# Patient Record
Sex: Male | Born: 1990 | Race: White | Hispanic: No | Marital: Single | Smoking: Never smoker
Health system: Southern US, Community
[De-identification: ages and names within clinical notes are randomized; demographics above are authoritative.]

## PROBLEM LIST (undated history)

## (undated) DIAGNOSIS — R51 Headache: Secondary | ICD-10-CM

---

## 2011-09-07 ENCOUNTER — Emergency Department (HOSPITAL_COMMUNITY)
Admission: EM | Admit: 2011-09-07 | Discharge: 2011-09-07 | Disposition: A | Discharge status: Home / Self Care | Payer: Self-pay | Attending: Emergency Medicine | Admitting: Emergency Medicine

## 2011-09-07 ENCOUNTER — Encounter (HOSPITAL_COMMUNITY): Payer: Self-pay

## 2011-09-07 DIAGNOSIS — K029 Dental caries, unspecified: Secondary | ICD-10-CM | POA: Insufficient documentation

## 2011-09-07 DIAGNOSIS — K0889 Other specified disorders of teeth and supporting structures: Secondary | ICD-10-CM

## 2011-09-07 MED ORDER — HYDROCODONE-ACETAMINOPHEN 5-325 MG PO TABS: ORAL_TABLET | ORAL | Status: AC

## 2011-09-07 MED ORDER — AMOXICILLIN 500 MG PO CAPS: 500.0000 mg | ORAL_CAPSULE | Freq: Three times a day (TID) | ORAL | Status: AC

## 2011-09-07 MED ORDER — AMOXICILLIN 250 MG PO CAPS
500.0000 mg | ORAL_CAPSULE | Freq: Once | ORAL | Status: AC
  Administered 2011-09-07: 500 mg via ORAL
  Filled 2011-09-07: qty 2

## 2011-09-07 MED ORDER — HYDROCODONE-ACETAMINOPHEN 5-325 MG PO TABS
1.0000 | ORAL_TABLET | Freq: Once | ORAL | Status: AC
  Administered 2011-09-07: 1 via ORAL
  Filled 2011-09-07: qty 1

## 2011-09-07 NOTE — ED Notes (Signed)
Pt reports tooth aching for the last few days. Pt has not contacted a dentist at this time.

## 2011-09-07 NOTE — ED Notes (Signed)
Pt alert & oriented x4, stable gait. Pt given discharge instructions, paperwork & prescription(s). Patient instructed to stop at the registration desk to finish any additional paperwork. pt verbalized understanding. Pt given a dental packet w/ discharge papers. Pt left department w/ no further questions.

## 2011-09-07 NOTE — Discharge Instructions (Signed)
Dental Pain A tooth ache may be caused by cavities (tooth decay). Cavities expose the nerve of the tooth to air and hot or cold temperatures. It may come from an infection or abscess (also called a boil or furuncle) around your tooth. It is also often caused by dental caries (tooth decay). This causes the pain you are having. DIAGNOSIS  Your caregiver can diagnose this problem by exam. TREATMENT   If caused by an infection, it may be treated with medications which kill germs (antibiotics) and pain medications as prescribed by your caregiver. Take medications as directed.   Only take over-the-counter or prescription medicines for pain, discomfort, or fever as directed by your caregiver.   Whether the tooth ache today is caused by infection or dental disease, you should see your dentist as soon as possible for further care.  SEEK MEDICAL CARE IF: The exam and treatment you received today has been provided on an emergency basis only. This is not a substitute for complete medical or dental care. If your problem worsens or new problems (symptoms) appear, and you are unable to meet with your dentist, call or return to this location. SEEK IMMEDIATE MEDICAL CARE IF:   You have a fever.   You develop redness and swelling of your face, jaw, or neck.   You are unable to open your mouth.   You have severe pain uncontrolled by pain medicine.  MAKE SURE YOU:   Understand these instructions.   Will watch your condition.   Will get help right away if you are not doing well or get worse.  Document Released: 03/24/2005 Document Revised: 03/13/2011 Document Reviewed: 11/10/2007 ExitCare Patient Information 2012 [employer].  RESOURCE GUIDE  Chronic Pain Problems: Contact [facility]  [phone] Patients need to be referred by their primary care doctor.  Insufficient Money for Medicine: Contact United Way:  call "211" or [facility] [phone].  No Primary Care  Doctor: - Call [facility]  [phone] - can help you locate a primary care doctor that  accepts your insurance, provides certain services, etc. - Physician Referral Service- 1-800-533-3463  Agencies that provide inexpensive medical care: - [facility]  [phone] - [facility]  [phone] - [facility]  [phone] - [facility]  [phone] - Planned Parenthood  [phone] - [facility]  [phone]  Medicaid-accepting [place] Providers: - [facility]- [address]  [phone], Mon-Fri 9am-7pm, Sat 9am-1pm - [facility]- [address], [address]  [phone] - [facility]- [address], [address]  [phone] - [facility]- [address] [place] [address]  [phone] - Veita Bland- [address], [phone]  Only accepts [place] Access Medicaid patients after they have their name  applied to their card  Self Pay (no insurance) in [place]: - Sickle Cell Patients: Dr Eric Dean, [facility]  [address], [phone] - [facility]- [address]  [phone]       -     [facility]- [address]       -     [facility]- see information above (Speak to Pam H if you do not have insurance)       -  [facility]- [address], [phone]       -    [facility] [place]- [address],  [phone]       -  [facility]- [address] [place] [address], [phone]       -  Dr Osei-Bonsu-  [address], [place], [phone]       -  [facility]- [address], [phone]       -  [facility]- [address] [place] [address], [phone], also [address], [phone]       -    [facility]- [address], [phone], 1st & 3rd Saturday   every month, 10am-1pm  1) Find a Doctor and Pay Out of  Pocket Although you won't have to find out who is covered by your insurance plan, it is a good idea to ask around and get recommendations. You will then need to call the office and see if the doctor you have chosen will accept you as a new patient and what types of options they offer for patients who are self-pay. Some doctors offer discounts or will set up payment plans for their patients who do not have insurance, but you will need to ask so you aren't surprised when you get to your appointment.  2) Contact Your Local Health Department Not all health departments have doctors that can see patients for sick visits, but many do, so it is worth a call to see if yours does. If you don't know where your local health department is, you can check in your phone book. The CDC also has a tool to help you locate your state's health department, and many state websites also have listings of all of their local health departments.  3) Find a Walk-in Clinic If your illness is not likely to be very severe or complicated, you may want to try a walk in clinic. These are popping up all over the country in pharmacies, drugstores, and shopping centers. They're usually staffed by nurse practitioners or physician assistants that have been trained to treat common illnesses and complaints. They're usually fairly quick and inexpensive. However, if you have serious medical issues or chronic medical problems, these are probably not your best option  STD Testing - [place] [facility] [place], [facility] Clinic, [address], [place], phone [phone] or 1-877-539-9860.  Monday - Friday, call for an appointment. - [place] [facility] [place], [facility] Clinic, [address], [place], phone [phone] or 1-877-539-9860.  Monday - Friday, call for an appointment.  Abuse/Neglect: - [place] Child Abuse Hotline (336) 641-3795 - [place] Child Abuse Hotline 800-378-5315  (After Hours)  Emergency Shelter:  [facility] (336) 271-5985  Maternity Homes: - [facility] (336) 275-9566 - [facility] (704) 372-4663  MRSA Hotline #:   832-7006  [place] Resources  [facility] [place]  [employer] [place] [facility] [address]                 [address]         [address]  [place]                                               [place]                                [place] Phone:  [phone]                                  Phone:  [phone]                   Phone:  [phone]  [place] [facility], [phone] - [place] Services - [facility]- 1-888-581-9988       -     [facility] in [place], [address],                                  336-349-4454, Insurance  [place] Child Abuse Hotline (336) 342-1394 or (336) 342-3537 (After Hours)   Behavioral Health Services  Substance Abuse Resources: - [facility]  336-882-2125 - [facility] 336-784-9470 - [facility] 336-285-9073 - [facility] 336-845-3988 - Residential & Outpatient Substance Abuse Program  800-659-3381  Psychological Services: - [facility]  832-9600 - [facility]  [phone] - [place] [facility], [address], [place], ACCESS LINE: 1-800-853-5163 or 336-641-4981, Http://www.[facility].com/services/adult.htm  Dental Assistance  If unable to pay or uninsured, contact:  [facility] or [place] [facility] to become qualified for the adult dental clinic.  Patients with Medicaid: [facility] [address], [phone], [phone]  If unable to pay, or uninsured, contact [facility] ([phone]) or [place] [facility] ([phone] in , [phone] in [place]) to  become qualified for the adult dental clinic  Other Low-Cost Community Dental Services: - [facility]- [address], [place], [place], [address], [phone], [address], 2nd and 4th Thursday of the month at 6:30am.  10 clients each day by appointment, can sometimes see walk-in patients if someone does not show for an appointment. - [facility]- [address], [place], [place], [address], [phone] - [facility]- [address], [place], [place], [address], [phone] - [place] [facility]- [phone] - [facility]- [phone] -  [facility]- [phone]     

## 2011-09-07 NOTE — ED Notes (Signed)
Back tooth hurting, started a couple of days ago per pt.

## 2011-09-07 NOTE — ED Provider Notes (Signed)
Medical screening examination/treatment/procedure(s) were performed by non-physician practitioner and as supervising physician I was immediately available for consultation/collaboration.  Amita Atayde, MD 09/07/11 0703 

## 2011-09-07 NOTE — ED Provider Notes (Signed)
History     CSN: [id number]  Arrival date & time 09/07/11  4098   First MD Initiated Contact with Patient 09/07/11 0048      Chief Complaint  Patient presents with  . Dental Pain    (Consider location/radiation/quality/duration/timing/severity/associated sxs/prior treatment) Patient is a 21 y.o. male presenting with tooth pain. The history is provided by the patient.  Dental PainThe primary symptoms include mouth pain. Primary symptoms do not include dental injury, headaches, fever, shortness of breath, sore throat, angioedema or cough. The symptoms began 3 to 5 days ago. The symptoms are worsening. The symptoms are new. The symptoms occur constantly.  Mouth pain began 3 - 5 days ago. Mouth pain occurs constantly. Mouth pain is worsening. Affected locations include: teeth and gum(s).  Additional symptoms include: dental sensitivity to temperature, gum tenderness and jaw pain. Additional symptoms do not include: gum swelling, trismus, facial swelling, trouble swallowing, ear pain and swollen glands. Medical issues include: periodontal disease.    History reviewed. No pertinent past medical history.  History reviewed. No pertinent past surgical history.  History reviewed. No pertinent family history.  History  Substance Use Topics  . Smoking status: Passive Smoker  . Smokeless tobacco: Not on file  . Alcohol Use: No      Review of Systems  Constitutional: Negative for fever, activity change and appetite change.  HENT: Positive for dental problem. Negative for ear pain, congestion, sore throat, facial swelling, trouble swallowing, neck pain and neck stiffness.   Eyes: Negative for pain and visual disturbance.  Respiratory: Negative for cough and shortness of breath.   Neurological: Negative for dizziness, facial asymmetry and headaches.  Hematological: Negative for adenopathy.  All other systems reviewed and are negative.    Allergies  Review of patient's allergies  indicates no known allergies.  Home Medications  No current outpatient prescriptions on file.  BP 144/70  Pulse 80  Temp(Src) 98.2 F (36.8 C) (Oral)  Resp 20  Ht 5\' 7"  (1.702 m)  Wt 160 lb (72.576 kg)  BMI 25.06 kg/m2  SpO2 100%  Physical Exam  Nursing note and vitals reviewed. Constitutional: He is oriented to person, place, and time. He appears well-developed and well-nourished. No distress.  HENT:  Head: Normocephalic and atraumatic. No trismus in the jaw.  Right Ear: Tympanic membrane and ear canal normal.  Left Ear: Tympanic membrane and ear canal normal.  Mouth/Throat: Uvula is midline, oropharynx is clear and moist and mucous membranes are normal. Dental caries present. No dental abscesses or uvula swelling.         ttp of the left lower third molar  Neck: Normal range of motion. Neck supple.  Cardiovascular: Normal rate, regular rhythm and normal heart sounds.   No murmur heard. Pulmonary/Chest: Effort normal and breath sounds normal.  Musculoskeletal: Normal range of motion.  Lymphadenopathy:    He has no cervical adenopathy.  Neurological: He is alert and oriented to person, place, and time. He exhibits normal muscle tone. Coordination normal.  Skin: Skin is warm and dry.    ED Course  Procedures (including critical care time)  Labs Reviewed - No data to display      MDM     Previous medical charts, nursing notes and vitals signs from this visit were reviewed by me   All laboratory results and/or imaging results performed on this visit, if applicable, were reviewed by me and discussed with the patient and/or parent as well as recommendation for follow-up  MEDICATIONS GIVEN IN ED: norco, amoxil  Patient has ttp of the left lower third molar.  Tooth appears slightly impacted.  Mild erythema of the surrounding gums.  No facial swelling, trismus or obvious abscess.  Pt agrees to f/u with a dentist.     PRESCRIPTIONS GIVEN AT DISCHARGE:  Amoxil,  norco #12     Pt stable in ED with no significant deterioration in condition. Pt feels improved after observation and/or treatment in ED. Patient / Family / Caregiver understand and agree with initial ED impression and plan with expectations set for ED visit.  Patient agrees to return to ED for any worsening symptoms       Crissy Mccreadie L. Deneshia Zucker, PA 09/07/11 0100

## 2011-10-18 ENCOUNTER — Emergency Department (HOSPITAL_COMMUNITY): Payer: Self-pay

## 2011-10-18 ENCOUNTER — Emergency Department (HOSPITAL_COMMUNITY)
Admission: EM | Admit: 2011-10-18 | Discharge: 2011-10-18 | Disposition: A | Discharge status: Home / Self Care | Payer: Self-pay | Attending: Emergency Medicine | Admitting: Emergency Medicine

## 2011-10-18 ENCOUNTER — Encounter (HOSPITAL_COMMUNITY): Payer: Self-pay | Admitting: *Deleted

## 2011-10-18 DIAGNOSIS — IMO0002 Reserved for concepts with insufficient information to code with codable children: Secondary | ICD-10-CM | POA: Insufficient documentation

## 2011-10-18 DIAGNOSIS — S20219A Contusion of unspecified front wall of thorax, initial encounter: Secondary | ICD-10-CM

## 2011-10-18 DIAGNOSIS — R071 Chest pain on breathing: Secondary | ICD-10-CM | POA: Insufficient documentation

## 2011-10-18 DIAGNOSIS — T07XXXA Unspecified multiple injuries, initial encounter: Secondary | ICD-10-CM

## 2011-10-18 MED ORDER — HYDROCODONE-ACETAMINOPHEN 5-325 MG PO TABS
1.0000 | ORAL_TABLET | Freq: Once | ORAL | Status: AC
  Administered 2011-10-18: 1 via ORAL
  Filled 2011-10-18: qty 1

## 2011-10-18 MED ORDER — IBUPROFEN 800 MG PO TABS
800.0000 mg | ORAL_TABLET | Freq: Once | ORAL | Status: AC
  Administered 2011-10-18: 800 mg via ORAL
  Filled 2011-10-18: qty 1

## 2011-10-18 MED ORDER — IBUPROFEN 800 MG PO TABS: 800.0000 mg | ORAL_TABLET | Freq: Three times a day (TID) | ORAL | Status: AC

## 2011-10-18 MED ORDER — HYDROCODONE-ACETAMINOPHEN 5-325 MG PO TABS: ORAL_TABLET | ORAL | Status: AC
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
## 2011-10-18 NOTE — Discharge Instructions (Signed)
Abrasions An abrasion is a scraped area on the skin. Abrasions do not go through all layers of the skin.  HOME CARE  Change any bandages (dressings) as told by your doctor. If the bandage sticks, soak it off with warm, soapy water. Change the bandage if it gets wet, dirty, or starts to smell.   Wash the area with soap and water twice a day. Rinse off the soap. Pat the area dry with a clean towel.   Look at the injured area for signs of infection. Infection signs include redness, puffiness (swelling), tenderness, or yellowish white fluid (pus) coming from the wound.   Apply medicated cream as told by your doctor.   Only take medicine as told by your doctor.   Follow up with your doctor as told.  GET HELP RIGHT AWAY IF:   You have more pain in your wound.   You have redness, puffiness (swelling), or tenderness around your wound.   You have yellowish white fluid (pus) coming from your wound.   You have a fever.   A bad smell is coming from the wound or bandage.  MAKE SURE YOU:   Understand these instructions.   Will watch your condition.   Will get help right away if you are not doing well or get worse.  Document Released: 09/10/2007 Document Revised: 03/13/2011 Document Reviewed: 02/25/2011 [facility] Patient Information 2012 [place], [place].Chest Contusion A contusion is a deep bruise. Bruises happen when an injury causes bleeding under the skin. Signs of bruising include pain, puffiness (swelling), and discolored skin. The bruise may turn blue, purple, or yellow. Pay attention to how you are doing. HOME CARE  Put ice on the injured area.   Put ice in a plastic bag.   Place a towel between the skin and the bag.   Leave the ice on for 15 to 20 minutes at a time, 3 to 4 times a day for the first 48 hours.   Rest.   Do not lift anything heavy.   Limit your activity as told by your doctor   Take 3 to 4 deep breaths every hour while awake. Hold your hand or a pillow over  the sore area for support.   Breathe from the belly (abdomen).   Breathe in through the nose, as if you are smelling a flower.   Breathe out through the mouth, as if you are blowing out a candle.   Only take medicine as told by your doctor.  GET HELP RIGHT AWAY IF:   You have trouble breathing or cough up thick spit (mucus).   You have chest pain that goes into the arms or jaw.   The skin is wet and pale.   You have a fever.   You feel dizzy, weak, or pass out (faint).   You cannot breathe easily.   The bruise is getting worse.  MAKE SURE YOU:   Understand these instructions.   Will watch your condition.   Will get help right away if you are not doing well or get worse.  Document Released: 09/10/2007 Document Revised: 03/13/2011 Document Reviewed: 09/10/2007 [employer] Patient Information 2012 [employer], [place]ib Contusion A rib contusion (bruise) can occur by a blow to the chest or by a fall against a hard object. Usually these will be much better in a couple weeks. If X-rays were taken today and there are no broken bones (fractures), the diagnosis of bruising is made. However, broken ribs may not show up for several days,  or may be discovered later on a routine X-ray when signs of healing show up. If this happens to you, it does not mean that something was missed on the X-ray, but simply that it did not show up on the first X-rays. Earlier diagnosis will not usually change the treatment. HOME CARE INSTRUCTIONS   Avoid strenuous activity. Be careful during activities and avoid bumping the injured ribs. Activities that pull on the injured ribs and cause pain should be avoided, if possible.   For the first day or two, an ice pack used every 20 minutes while awake may be helpful. Put ice in a plastic bag and put a towel between the bag and the skin.   Eat a normal, well-balanced diet. Drink plenty of fluids to avoid constipation.   Take deep breaths several times a day to keep  lungs free of infection. Try to cough several times a day. Splint the injured area with a pillow while coughing to ease pain. Coughing can help prevent pneumonia.   Wear a rib belt or binder only if told to do so by your caregiver. If you are wearing a rib belt or binder, you must do the breathing exercises as directed by your caregiver. If not used properly, rib belts or binders restrict breathing which can lead to pneumonia.   Only take over-the-counter or prescription medicines for pain, discomfort, or fever as directed by your caregiver.  SEEK MEDICAL CARE IF:   You or your child has an oral temperature above 102 F (38.9 C).   Your baby is older than 3 months with a rectal temperature of 100.5 F (38.1 C) or higher for more than 1 day.   You develop a cough, with thick or bloody sputum.  SEEK IMMEDIATE MEDICAL CARE IF:   You have difficulty breathing.   You feel sick to your stomach (nausea), have vomiting or belly (abdominal) pain.   You have worsening pain, not controlled with medications, or there is a change in the location of the pain.   You develop sweating or radiation of the pain into the arms, jaw or shoulders, or become light headed or faint.   You or your child has an oral temperature above 102 F (38.9 C), not controlled by medicine.   Your or your baby is older than 3 months with a rectal temperature of 102 F (38.9 C) or higher.   Your baby is 29 months old or younger with a rectal temperature of 100.4 F (38 C) or higher.  MAKE SURE YOU:   Understand these instructions.   Will watch your condition.   Will get help right away if you are not doing well or get worse.  Document Released: 12/17/2000 Document Revised: 03/13/2011 Document Reviewed: 11/10/2007 [facility] Patient Information 2012 [place], [place].C.

## 2011-10-19 NOTE — ED Provider Notes (Signed)
Medical screening examination/treatment/procedure(s) were performed by non-physician practitioner and as supervising physician I was immediately available for consultation/collaboration. Devoria Albe, MD, FACEP   Ward Givens, MD 10/19/11 [phone]

## 2012-04-21 ENCOUNTER — Encounter (HOSPITAL_COMMUNITY): Payer: Self-pay | Admitting: Emergency Medicine

## 2012-04-21 ENCOUNTER — Emergency Department (HOSPITAL_COMMUNITY): Payer: Self-pay

## 2012-04-21 ENCOUNTER — Emergency Department (HOSPITAL_COMMUNITY)
Admission: EM | Admit: 2012-04-21 | Discharge: 2012-04-21 | Disposition: A | Discharge status: Home / Self Care | Payer: Self-pay | Attending: Emergency Medicine | Admitting: Emergency Medicine

## 2012-04-21 DIAGNOSIS — R42 Dizziness and giddiness: Secondary | ICD-10-CM | POA: Insufficient documentation

## 2012-04-21 DIAGNOSIS — Z79899 Other long term (current) drug therapy: Secondary | ICD-10-CM | POA: Insufficient documentation

## 2012-04-21 DIAGNOSIS — R0602 Shortness of breath: Secondary | ICD-10-CM | POA: Insufficient documentation

## 2012-04-21 LAB — CBC WITH DIFFERENTIAL/PLATELET
Eosinophils Relative: 1 % (ref 0–5)
Lymphocytes Relative: 19 % (ref 12–46)
Lymphs Abs: 1.7 10*3/uL (ref 0.7–4.0)
MCV: 83.9 fL (ref 78.0–100.0)
Platelets: 231 10*3/uL (ref 150–400)
RBC: 5.53 MIL/uL (ref 4.22–5.81)
WBC: 9.1 10*3/uL (ref 4.0–10.5)

## 2012-04-21 LAB — D-DIMER, QUANTITATIVE: D-Dimer, Quant: <0.27 ug/mL-FEU (ref 0.00–0.48)

## 2012-04-21 LAB — BASIC METABOLIC PANEL
CO2: 29 mEq/L (ref 19–32)
Calcium: 9.9 mg/dL (ref 8.4–10.5)
Chloride: 98 mEq/L (ref 96–112)
Sodium: 138 mEq/L (ref 135–145)

## 2012-04-21 MED ORDER — MECLIZINE HCL 12.5 MG PO TABS
25.0000 mg | ORAL_TABLET | Freq: Once | ORAL | Status: AC
  Administered 2012-04-21: 25 mg via ORAL
  Filled 2012-04-21: qty 2

## 2012-04-21 MED ORDER — MECLIZINE HCL 25 MG PO TABS: 25.0000 mg | ORAL_TABLET | Freq: Four times a day (QID) | ORAL | Status: DC

## 2012-04-21 MED ORDER — ALBUTEROL SULFATE HFA 108 (90 BASE) MCG/ACT IN AERS: 1.0000 | INHALATION_SPRAY | Freq: Four times a day (QID) | RESPIRATORY_TRACT | Status: DC | PRN

## 2012-04-21 MED ORDER — ALBUTEROL SULFATE (5 MG/ML) 0.5% IN NEBU
2.5000 mg | INHALATION_SOLUTION | Freq: Once | RESPIRATORY_TRACT | Status: AC
  Administered 2012-04-21: 2.5 mg via RESPIRATORY_TRACT
  Filled 2012-04-21: qty 0.5

## 2012-04-21 NOTE — ED Provider Notes (Signed)
History  This chart was scribed for Shelda Jakes, MD by Erskine Emery, ED Scribe. This patient was seen in room APA19/APA19 and the patient's care was started at 07:30.   CSN: [id number]  Arrival date & time 04/21/12  0716   First MD Initiated Contact with Patient 04/21/12 0730      Chief Complaint  Patient presents with  . Shortness of Breath    (Consider location/radiation/quality/duration/timing/severity/associated sxs/prior treatment) The history is provided by the patient. No language interpreter was used.  Miguel Ballard is a 22 y.o. male who presents to the Emergency Department complaining of SOB since last night. Pt reports he is fine when he yawns but he just can't seem to get a deep breath. He denies walking aggravates the SOB. Pt denies any associated cough, congestion, nausea, fever, chills, sore throat, rhinorrhea, visual changes, neck pain, back pain, chest pain, emesis, diarrhea, abdominal pain, dysuria, rash, h/o bleeding easily, or injury but reports some dizziness upon standing with no vertigo. Pt has no h/o asthma or wheezing. He is otherwise perfectly healthy.  History reviewed. No pertinent past medical history.  History reviewed. No pertinent past surgical history.  No family history on file.  History  Substance Use Topics  . Smoking status: Passive Smoke Exposure - Never Smoker  . Smokeless tobacco: Not on file  . Alcohol Use: No      Review of Systems  Constitutional: Negative for fever and chills.  HENT: Negative for congestion, rhinorrhea and neck pain.   Eyes: Negative for visual disturbance.  Respiratory: Positive for shortness of breath. Negative for cough.   Cardiovascular: Negative for chest pain.  Gastrointestinal: Negative for nausea, vomiting, abdominal pain and diarrhea.  Genitourinary: Negative for dysuria.  Musculoskeletal: Negative for back pain.  Skin: Negative for rash.  Neurological: Positive for dizziness.  All other systems  reviewed and are negative.    Allergies  Review of patient's allergies indicates no known allergies.  Home Medications   Current Outpatient Rx  Name  Route  Sig  Dispense  Refill  . IBUPROFEN 200 MG PO TABS   Oral   Take 600 mg by mouth every 6 (six) hours as needed. Headache.         . ALBUTEROL SULFATE HFA 108 (90 BASE) MCG/ACT IN AERS   Inhalation   Inhale 1-2 puffs into the lungs every 6 (six) hours as needed for wheezing.   1 Inhaler   0   . MECLIZINE HCL 25 MG PO TABS   Oral   Take 1 tablet (25 mg total) by mouth 4 (four) times daily.   28 tablet   0     Triage Vitals: BP 130/83  Pulse 70  Temp 97.8 F (36.6 C) (Oral)  Resp 18  Ht 5\' 7"  (1.702 m)  Wt 161 lb (73.029 kg)  BMI 25.22 kg/m2  SpO2 100%  Physical Exam  Nursing note and vitals reviewed. Constitutional: He is oriented to person, place, and time. He appears well-developed and well-nourished. No distress.  HENT:  Head: Normocephalic and atraumatic.  Mouth/Throat: Oropharynx is clear and moist. No oropharyngeal exudate.       Mild erythema to the back of the throat.  Eyes: EOM are normal. Pupils are equal, round, and reactive to light.  Neck: Neck supple. No tracheal deviation present.  Cardiovascular: Normal rate, regular rhythm and normal heart sounds.   Pulmonary/Chest: Effort normal and breath sounds normal. No respiratory distress. He has no wheezes.  Abdominal: Soft.  Bowel sounds are normal. He exhibits no distension. There is no tenderness.  Musculoskeletal: Normal range of motion. He exhibits no edema.  Lymphadenopathy:    He has no cervical adenopathy.  Neurological: He is alert and oriented to person, place, and time. No cranial nerve deficit. Coordination normal.       Mild component of vertigo: pt reports aggravated dizziness upon moving his head back and forth.  Skin: Skin is warm and dry.  Psychiatric: He has a normal mood and affect.    ED Course  Procedures (including critical  care time) DIAGNOSTIC STUDIES: Oxygen Saturation is 100% on room air, normal by my interpretation.    COORDINATION OF CARE: 07:52--I evaluated the patient and we discussed a treatment plan including chest x-ray and basic labs to which the pt agreed.   08:39--I rechecked the pt and notified him that his chest x-ray looks clear but that we are still waiting for his blood work and breathing treatment.  08:54--I rechecked the pt.   Results for orders placed during the hospital encounter of 04/21/12  BASIC METABOLIC PANEL      Component Value Range   Sodium 138  135 - 145 mEq/L   Potassium 3.3 (*) 3.5 - 5.1 mEq/L   Chloride 98  96 - 112 mEq/L   CO2 29  19 - 32 mEq/L   Glucose, Bld 96  70 - 99 mg/dL   BUN 6  6 - 23 mg/dL   Creatinine, Ser 1.61  0.50 - 1.35 mg/dL   Calcium 9.9  8.4 - 09.6 mg/dL   GFR calc non Af Amer >90  >90 mL/min   GFR calc Af Amer >90  >90 mL/min  CBC WITH DIFFERENTIAL      Component Value Range   WBC 9.1  4.0 - 10.5 K/uL   RBC 5.53  4.22 - 5.81 MIL/uL   Hemoglobin 15.7  13.0 - 17.0 g/dL   HCT 04.5  40.9 - 81.1 %   MCV 83.9  78.0 - 100.0 fL   MCH 28.4  26.0 - 34.0 pg   MCHC 33.8  30.0 - 36.0 g/dL   RDW 91.4  78.2 - 95.6 %   Platelets 231  150 - 400 K/uL   Neutrophils Relative 73  43 - 77 %   Neutro Abs 6.7  1.7 - 7.7 K/uL   Lymphocytes Relative 19  12 - 46 %   Lymphs Abs 1.7  0.7 - 4.0 K/uL   Monocytes Relative 7  3 - 12 %   Monocytes Absolute 0.7  0.1 - 1.0 K/uL   Eosinophils Relative 1  0 - 5 %   Eosinophils Absolute 0.1  0.0 - 0.7 K/uL   Basophils Relative 0  0 - 1 %   Basophils Absolute 0.0  0.0 - 0.1 K/uL  D-DIMER, QUANTITATIVE      Component Value Range   D-Dimer, Quant <0.27  0.00 - 0.48 ug/mL-FEU   Dg Chest 2 View  04/21/2012  *RADIOLOGY REPORT*  Clinical Data: Short of breath, chest pain  CHEST - 2 VIEW  Comparison: Prior chest x-ray 10/18/2011  Findings: The lungs are well-aerated and free from pulmonary edema, focal airspace consolidation or  pulmonary nodule.  Cardiac and mediastinal contours are within normal limits.  No pneumothorax, or pleural effusion. No acute osseous findings.  IMPRESSION:  No acute cardiopulmonary disease.   Original Report Authenticated By: Malachy Moan, M.D.       1. Shortness of breath  2. Dizziness       MDM   Chest x-ray negative for pneumonia or pneumothorax screening test for pulmonary embolism was negative. Patient without significant improvement with albuterol inhaler and had no wheezing. Some improvement with the dizziness which had some mild vertigo type component to it with the Antivert. Will continue patient on Antivert albuterol inhaler. Patient's pulse ox very normal in the emergency department.     I personally performed the services described in this documentation, which was scribed in my presence. The recorded information has been reviewed and is accurate.     Shelda Jakes, MD 04/21/12 [phone]

## 2012-04-21 NOTE — ED Notes (Addendum)
Pt c/o sob since last night. Denies cough/congestion. Denies injury. Also reports some dizziness. Pt tearful, appears anxious.

## 2012-07-07 ENCOUNTER — Emergency Department (HOSPITAL_COMMUNITY): Payer: Self-pay

## 2012-07-07 ENCOUNTER — Emergency Department (HOSPITAL_COMMUNITY)
Admission: EM | Admit: 2012-07-07 | Discharge: 2012-07-08 | Disposition: A | Discharge status: Home / Self Care | Payer: Self-pay | Attending: Emergency Medicine | Admitting: Emergency Medicine

## 2012-07-07 ENCOUNTER — Encounter (HOSPITAL_COMMUNITY): Payer: Self-pay | Admitting: *Deleted

## 2012-07-07 DIAGNOSIS — Z79899 Other long term (current) drug therapy: Secondary | ICD-10-CM | POA: Insufficient documentation

## 2012-07-07 DIAGNOSIS — R51 Headache: Secondary | ICD-10-CM | POA: Insufficient documentation

## 2012-07-07 HISTORY — DX: Headache: R51

## 2012-07-07 MED ORDER — METOCLOPRAMIDE HCL 5 MG/ML IJ SOLN
10.0000 mg | Freq: Once | INTRAMUSCULAR | Status: AC
  Administered 2012-07-07: 10 mg via INTRAMUSCULAR
  Filled 2012-07-07: qty 2

## 2012-07-07 MED ORDER — KETOROLAC TROMETHAMINE 60 MG/2ML IM SOLN
60.0000 mg | Freq: Once | INTRAMUSCULAR | Status: AC
  Administered 2012-07-07: 60 mg via INTRAMUSCULAR
  Filled 2012-07-07: qty 2

## 2012-07-07 MED ORDER — DIPHENHYDRAMINE HCL 50 MG/ML IJ SOLN
25.0000 mg | Freq: Once | INTRAMUSCULAR | Status: AC
  Administered 2012-07-07: 25 mg via INTRAMUSCULAR
  Filled 2012-07-07: qty 1

## 2012-07-07 NOTE — ED Notes (Signed)
Pt c/o HA since yesterday, denies N/V, has taken OTC meds- aleve and Goody powder, last took 0800 this morning per pt.

## 2012-07-07 NOTE — ED Provider Notes (Signed)
History  This chart was scribed for Miguel Octave, MD by Shari Heritage, ED Scribe. The patient was seen in room APA12/APA12. Patient's care was started at 2217.   CSN: [id number]  Arrival date & time 07/07/12  2140   First MD Initiated Contact with Patient 07/07/12 2217      Chief Complaint  Patient presents with  . Headache     The history is provided by the patient. A language interpreter was used.    HPI Comments: Miguel Ballard is a 22 y.o. male who presents to the Emergency Department complaining of gradual, progressively worsening, moderate to severe, constant diffuse headache onset  yesterday. He states that headache started in the frontal area and is now generalized. He states that he was sitting down when headache began. Patient reports that he had the headache when he went to bed last night and it was still present upon waking this morning. He says that current headache is similar to past in position and character, but is more severe. Patient denies visual changes, vomiting, abdominal pain, diarrhea, fever or chills. He denies recent sore throat, rhinorrhea or congestion. He has taken Aleve and Goody's Powder with no relief. He has no known allergies to medications. Patient does not smoke or use alcohol.    Past Medical History  Diagnosis Date  . Headache     History reviewed. No pertinent past surgical history.  History reviewed. No pertinent family history.  History  Substance Use Topics  . Smoking status: Passive Smoke Exposure - Never Smoker  . Smokeless tobacco: Not on file  . Alcohol Use: No      Review of Systems A complete 10 system review of systems was obtained and all systems are negative except as noted in the HPI and PMH.   Allergies  Review of patient's allergies indicates no known allergies.  Home Medications   Current Outpatient Rx  Name  Route  Sig  Dispense  Refill  . naproxen sodium (ALEVE) 220 MG tablet   Oral   Take 440 mg by mouth daily  as needed (for pain).         Marland Kitchen albuterol (PROVENTIL HFA;VENTOLIN HFA) 108 (90 BASE) MCG/ACT inhaler   Inhalation   Inhale 1-2 puffs into the lungs every 6 (six) hours as needed for wheezing.   1 Inhaler   0     Triage Vitals: BP 140/89  Pulse 90  Temp(Src) 99.3 F (37.4 C) (Oral)  Resp 24  Wt 155 lb 6.4 oz (70.489 kg)  BMI 24.33 kg/m2  SpO2 100%  Physical Exam  Constitutional: He is oriented to person, place, and time. He appears well-developed and well-nourished.  HENT:  Head: Normocephalic and atraumatic.  Mouth/Throat: Oropharynx is clear and moist and mucous membranes are normal. Mucous membranes are not dry.  Neck: Normal range of motion. Neck supple.  No meningismus.  Cardiovascular: Normal rate, regular rhythm and normal heart sounds.   Pulmonary/Chest: Effort normal and breath sounds normal.  Abdominal: Soft. He exhibits no distension. There is no tenderness.  Musculoskeletal: Normal range of motion. He exhibits no edema and no tenderness.  Neurological: He is alert and oriented to person, place, and time.  CN 2-12 intact. No ataxia on finger to nose bilaterally. 5/5 strength throughout.   Skin: Skin is warm. No rash noted.    ED Course  Procedures (including critical care time) DIAGNOSTIC STUDIES: Oxygen Saturation is 100% on room air, normal by my interpretation.    COORDINATION OF  CARE: 10:33 PM- Patient informed of current plan for treatment and evaluation and agrees with plan at this time.     Labs Reviewed - No data to display Ct Head Wo Contrast  07/07/2012  *RADIOLOGY REPORT*  Clinical Data: Frontal headaches today.  CT HEAD WITHOUT CONTRAST  Technique:  Contiguous axial images were obtained from the base of the skull through the vertex without contrast.  Comparison: None.  Findings: The ventricles and sulci are symmetrical without significant effacement, displacement, or dilatation. No mass effect or midline shift. No abnormal extra-axial fluid  collections. The grey-white matter junction is distinct. Basal cisterns are not effaced. No acute intracranial hemorrhage. No depressed skull fractures.  Visualized paranasal sinuses and mastoid air cells are not opacified.  IMPRESSION: No acute intracranial abnormalities.   Original Report Authenticated By: Burman Nieves, M.D.      1. Headache       MDM  2 day history of gradually worsening frontal headache that is minimally responsive to aleve. No nausea, vomiting, fever, weakness numbness or tingling. Denies thunderclap onset.  Patient is a poor historian. He has a nonfocal neurological exam. No meningismus. History not consistent with subarachnoid hemorrhage or meningitis.  He is given medication with improvement in his headache. He is tolerating by mouth.    I personally performed the services described in this documentation, which was scribed in my presence. The recorded information has been reviewed and is accurate.    Miguel Octave, MD 07/08/12 0010

## 2013-04-01 ENCOUNTER — Encounter (HOSPITAL_COMMUNITY): Payer: Self-pay | Admitting: Emergency Medicine

## 2013-04-01 ENCOUNTER — Emergency Department (HOSPITAL_COMMUNITY): Payer: BC Managed Care – HMO

## 2013-04-01 ENCOUNTER — Emergency Department (HOSPITAL_COMMUNITY)
Admission: EM | Admit: 2013-04-01 | Discharge: 2013-04-01 | Disposition: A | Discharge status: Home / Self Care | Payer: BC Managed Care – HMO | Attending: Emergency Medicine | Admitting: Emergency Medicine

## 2013-04-01 DIAGNOSIS — S62339A Displaced fracture of neck of unspecified metacarpal bone, initial encounter for closed fracture: Secondary | ICD-10-CM

## 2013-04-01 DIAGNOSIS — S62309A Unspecified fracture of unspecified metacarpal bone, initial encounter for closed fracture: Secondary | ICD-10-CM | POA: Insufficient documentation

## 2013-04-01 MED ORDER — OXYCODONE-ACETAMINOPHEN 5-325 MG PO TABS: 2.0000 | ORAL_TABLET | Freq: Four times a day (QID) | ORAL | Status: DC | PRN
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
## 2013-04-01 NOTE — ED Provider Notes (Signed)
Medical screening examination/treatment/procedure(s) were performed by non-physician practitioner and as supervising physician I was immediately available for consultation/collaboration.  EKG Interpretation   None         Diarra Ceja L Yocelyn Brocious, MD 04/01/13 2015 
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
## 2013-04-01 NOTE — ED Notes (Signed)
Pt seen and evaluated by EDPa for initial assessment. 

## 2013-04-09 ENCOUNTER — Emergency Department (HOSPITAL_COMMUNITY)
Admission: EM | Admit: 2013-04-09 | Discharge: 2013-04-09 | Disposition: A | Discharge status: Home / Self Care | Payer: Self-pay | Attending: Emergency Medicine | Admitting: Emergency Medicine

## 2013-04-09 ENCOUNTER — Encounter (HOSPITAL_COMMUNITY): Payer: Self-pay | Admitting: Emergency Medicine

## 2013-04-09 DIAGNOSIS — G8911 Acute pain due to trauma: Secondary | ICD-10-CM | POA: Insufficient documentation

## 2013-04-09 DIAGNOSIS — Z760 Encounter for issue of repeat prescription: Secondary | ICD-10-CM | POA: Insufficient documentation

## 2013-04-09 DIAGNOSIS — M25549 Pain in joints of unspecified hand: Secondary | ICD-10-CM | POA: Insufficient documentation

## 2013-04-09 MED ORDER — KETOROLAC TROMETHAMINE 10 MG PO TABS
10.0000 mg | ORAL_TABLET | Freq: Once | ORAL | Status: AC
  Administered 2013-04-09: 10 mg via ORAL
  Filled 2013-04-09: qty 1

## 2013-04-09 MED ORDER — DICLOFENAC SODIUM 75 MG PO TBEC: 75.0000 mg | DELAYED_RELEASE_TABLET | Freq: Two times a day (BID) | ORAL | Status: DC

## 2013-04-09 MED ORDER — ACETAMINOPHEN 325 MG PO TABS
650.0000 mg | ORAL_TABLET | Freq: Once | ORAL | Status: AC
  Administered 2013-04-09: 650 mg via ORAL
  Filled 2013-04-09: qty 2

## 2013-04-09 NOTE — Discharge Instructions (Signed)
No acute changes noted on your hand examination tonight. It is important that you use your splint until seen by the hand specialist. Prescription for diclofenac given at this time. Please use Tylenol extra strength in between the doses of diclofenac. Please see your primary physician, or health department, or physicians at the adult medicine clinic for assistance with this problem until you can see your specialist. The emergency department is not set up for pain management. Medication Refill, Emergency Department We have refilled your medication today as a courtesy to you. It is best for your medical care, however, to take care of getting refills done through your primary caregiver's office. They have your records and can do a better job of follow-up than we can in the emergency department. On maintenance medications, we often only prescribe enough medications to get you by until you are able to see your regular caregiver. This is a more expensive way to refill medications. In the future, please plan for refills so that you will not have to use the emergency department for this. Thank you for your help. Your help allows us to better take care of the daily emergencies that enter our department. Document Released: 07/11/2003 Document Revised: 06/16/2011 Document Reviewed: 03/24/2005 [facility] Patient Information 2014 [place], [place].
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
## 2013-04-10 NOTE — ED Provider Notes (Signed)
Medical screening examination/treatment/procedure(s) were performed by non-physician practitioner and as supervising physician I was immediately available for consultation/collaboration.  EKG Interpretation   None       Pauletta Pickney, MD, FACEP   Montel Vanderhoof L Kashis Penley, MD 04/10/13 0031 

## 2013-12-16 ENCOUNTER — Emergency Department (HOSPITAL_COMMUNITY)
Admission: EM | Admit: 2013-12-16 | Discharge: 2013-12-16 | Disposition: A | Discharge status: Home / Self Care | Payer: Self-pay

## 2014-08-07 ENCOUNTER — Emergency Department (HOSPITAL_COMMUNITY)
Admission: EM | Admit: 2014-08-07 | Discharge: 2014-08-07 | Disposition: A | Discharge status: Home / Self Care | Payer: Self-pay | Attending: Emergency Medicine | Admitting: Emergency Medicine

## 2014-08-07 ENCOUNTER — Encounter (HOSPITAL_COMMUNITY): Payer: Self-pay | Admitting: Emergency Medicine

## 2014-08-07 DIAGNOSIS — Y9289 Other specified places as the place of occurrence of the external cause: Secondary | ICD-10-CM | POA: Insufficient documentation

## 2014-08-07 DIAGNOSIS — Y9389 Activity, other specified: Secondary | ICD-10-CM | POA: Insufficient documentation

## 2014-08-07 DIAGNOSIS — S0501XA Injury of conjunctiva and corneal abrasion without foreign body, right eye, initial encounter: Secondary | ICD-10-CM | POA: Insufficient documentation

## 2014-08-07 DIAGNOSIS — W228XXA Striking against or struck by other objects, initial encounter: Secondary | ICD-10-CM | POA: Insufficient documentation

## 2014-08-07 DIAGNOSIS — Y998 Other external cause status: Secondary | ICD-10-CM | POA: Insufficient documentation

## 2014-08-07 DIAGNOSIS — H1131 Conjunctival hemorrhage, right eye: Secondary | ICD-10-CM | POA: Insufficient documentation

## 2014-08-07 DIAGNOSIS — Z23 Encounter for immunization: Secondary | ICD-10-CM | POA: Insufficient documentation

## 2014-08-07 DIAGNOSIS — S0990XA Unspecified injury of head, initial encounter: Secondary | ICD-10-CM | POA: Insufficient documentation

## 2014-08-07 DIAGNOSIS — Z791 Long term (current) use of non-steroidal anti-inflammatories (NSAID): Secondary | ICD-10-CM | POA: Insufficient documentation

## 2014-08-07 MED ORDER — TETRACAINE HCL 0.5 % OP SOLN
1.0000 [drp] | Freq: Once | OPHTHALMIC | Status: AC
  Administered 2014-08-07: 1 [drp] via OPHTHALMIC
  Filled 2014-08-07: qty 2

## 2014-08-07 MED ORDER — FLUORESCEIN SODIUM 1 MG OP STRP
1.0000 | ORAL_STRIP | Freq: Once | OPHTHALMIC | Status: AC
  Administered 2014-08-07: 1 via OPHTHALMIC
  Filled 2014-08-07: qty 1

## 2014-08-07 MED ORDER — ERYTHROMYCIN 5 MG/GM OP OINT
TOPICAL_OINTMENT | Freq: Once | OPHTHALMIC | Status: AC
  Administered 2014-08-07: 20:00:00 via OPHTHALMIC
  Filled 2014-08-07: qty 3.5

## 2014-08-07 MED ORDER — TETANUS-DIPHTH-ACELL PERTUSSIS 5-2.5-18.5 LF-MCG/0.5 IM SUSP
0.5000 mL | Freq: Once | INTRAMUSCULAR | Status: AC
  Administered 2014-08-07: 0.5 mL via INTRAMUSCULAR
  Filled 2014-08-07: qty 0.5

## 2014-08-07 MED ORDER — IBUPROFEN 800 MG PO TABS
800.0000 mg | ORAL_TABLET | Freq: Once | ORAL | Status: AC
  Administered 2014-08-07: 800 mg via ORAL
  Filled 2014-08-07: qty 1
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
## 2014-08-07 NOTE — ED Provider Notes (Signed)
CSN: [id number]     Arrival date & time 08/07/14  4098 History  This chart was scribed for non-physician practitioner, Burgess Amor, PA-C, working with Bethann Berkshire, MD, by Modena Jansky, ED Scribe. This patient was seen in room APFT23/APFT23 and the patient's care was started at 7:14 PM.   Chief Complaint  Patient presents with  . Eye Injury   The history is provided by the patient. No language interpreter was used.   HPI Comments: Miguel Ballard is a 24 y.o. male who presents to the Emergency Department complaining of a right eye injury that occurred about 3 hours ago. He reports that his eye got hit with a piece of tile or cement while removing a floor tile about 3 hours ago. He states that he has constant moderate right eye pain with associated clear drainage and headache. He reports that he does not wear glasses or contacts. He denies any visual disturbance or photophobia. He states that he is unsure of his tetanus status.   Past Medical History  Diagnosis Date  . Headache(784.0)    History reviewed. No pertinent past surgical history. History reviewed. No pertinent family history. History  Substance Use Topics  . Smoking status: Passive Smoke Exposure - Never Smoker  . Smokeless tobacco: Not on file  . Alcohol Use: Yes     Comment: occasional     Review of Systems  Constitutional: Negative for fever.  HENT: Negative for congestion and sore throat.   Eyes: Positive for pain. Negative for photophobia and visual disturbance.  Respiratory: Negative for chest tightness and shortness of breath.   Cardiovascular: Negative for chest pain.  Gastrointestinal: Negative for nausea and abdominal pain.  Genitourinary: Negative.   Musculoskeletal: Negative for joint swelling, arthralgias and neck pain.  Skin: Negative.  Negative for rash and wound.  Neurological: Positive for headaches. Negative for dizziness, weakness, light-headedness and numbness.  Psychiatric/Behavioral: Negative.      Allergies  Review of patient's allergies indicates no known allergies.  Home Medications   Prior to Admission medications   Medication Sig Start Date End Date Taking? Authorizing Provider  diclofenac (VOLTAREN) 75 MG EC tablet Take 1 tablet (75 mg total) by mouth 2 (two) times daily. Patient not taking: Reported on 08/07/2014 04/09/13   Ivery Quale, PA-C   BP 121/88 mmHg  Pulse 86  Temp(Src) 97.8 F (36.6 C) (Oral)  Resp 24  Ht  (1.702 m)  Wt 175 lb 6.4 oz (79.561 kg)  BMI 27.47 kg/m2  SpO2 100% Physical Exam  Constitutional: He appears well-developed and well-nourished.  HENT:  Head: Normocephalic and atraumatic.  Eyes: Conjunctivae and EOM are normal. Pupils are equal, round, and reactive to light.  Slit lamp exam:      The right eye shows corneal abrasion and fluorescein uptake. The right eye shows no corneal flare and no hyphema.    Corneal abrasion at the inferior cornea and subconjunctival hemorrhage on lateral sclera. No consensual pain to light.   Neck: Normal range of motion.  Cardiovascular: Normal rate.   Pulmonary/Chest: Effort normal.  Abdominal: Soft. Bowel sounds are normal. There is no tenderness.  Musculoskeletal: Normal range of motion.  Neurological: He is alert.  Skin: Skin is warm and dry.  Psychiatric: He has a normal mood and affect.  Nursing note and vitals reviewed.   ED Course  Procedures (including critical care time) DIAGNOSTIC STUDIES: Oxygen Saturation is 100% on RA, normal by my interpretation.    COORDINATION OF CARE:  7:18 PM- Pt advised of plan for treatment which includes medication and pt agrees.  Labs Review Labs Reviewed - No data to display  Imaging Review No results found.   EKG Interpretation None      MDM   Final diagnoses:  Corneal abrasion, right, initial encounter  Subconjunctival hemorrhage of right eye    Pt given erythromycin ophthalmic ointment, ibuprofen.  Avoid rubbing eye.  Tetanus updated.   Referral to Dr. Lita MainsHaines for a recheck if pain is not improved over the next 3-4 days.  I personally performed the services described in this documentation, which was scribed in my presence. The recorded information has been reviewed and is accurate.   Burgess AmorJulie Mikaiya Tramble, PA-C 08/10/14 0015  Bethann BerkshireJoseph Zammit, MD 08/10/14 1325

## 2014-08-07 NOTE — Discharge Instructions (Signed)
Corneal Abrasion The cornea is the clear covering at the front and center of the eye. When looking at the colored portion of the eye (iris), you are looking through the cornea. This very thin tissue is made up of many layers. The surface layer is a single layer of cells (corneal epithelium) and is one of the most sensitive tissues in the body. If a scratch or injury causes the corneal epithelium to come off, it is called a corneal abrasion. If the injury extends to the tissues below the epithelium, the condition is called a corneal ulcer. CAUSES   Scratches.  Trauma.  Foreign body in the eye. Some people have recurrences of abrasions in the area of the original injury even after it has healed (recurrent erosion syndrome). Recurrent erosion syndrome generally improves and goes away with time. SYMPTOMS   Eye pain.  Difficulty or inability to keep the injured eye open.  The eye becomes very sensitive to light.  Recurrent erosions tend to happen suddenly, first thing in the morning, usually after waking up and opening the eye. DIAGNOSIS  Your health care provider can diagnose a corneal abrasion during an eye exam. Dye is usually placed in the eye using a drop or a small paper strip moistened by your tears. When the eye is examined with a special light, the abrasion shows up clearly because of the dye. TREATMENT   Small abrasions may be treated with antibiotic drops or ointment alone.  A pressure patch may be put over the eye. If this is done, follow your doctor's instructions for when to remove the patch. Do not drive or use machines while the eye patch is on. Judging distances is hard to do with a patch on. If the abrasion becomes infected and spreads to the deeper tissues of the cornea, a corneal ulcer can result. This is serious because it can cause corneal scarring. Corneal scars interfere with light passing through the cornea and cause a loss of vision in the involved eye. HOME CARE  INSTRUCTIONS  Use medicine or ointment as directed. Only take over-the-counter or prescription medicines for pain, discomfort, or fever as directed by your health care provider.  Do not drive or operate machinery if your eye is patched. Your ability to judge distances is impaired.  If your health care provider has given you a follow-up appointment, it is very important to keep that appointment. Not keeping the appointment could result in a severe eye infection or permanent loss of vision. If there is any problem keeping the appointment, let your health care provider know. SEEK MEDICAL CARE IF:   You have pain, light sensitivity, and a scratchy feeling in one eye or both eyes.  Your pressure patch keeps loosening up, and you can blink your eye under the patch after treatment.  Any kind of discharge develops from the eye after treatment or if the lids stick together in the morning.  You have the same symptoms in the morning as you did with the original abrasion days, weeks, or months after the abrasion healed. MAKE SURE YOU:   Understand these instructions.  Will watch your condition.  Will get help right away if you are not doing well or get worse. Document Released: 03/21/2000 Document Revised: 03/29/2013 Document Reviewed: 11/29/2012 [facility] Patient Information 2015 [place], [place]. This information is not intended to replace advice given to you by your health care provider. Make sure you discuss any questions you have with your health care provider.  Subconjunctival Hemorrhage  Your exam shows you have a subconjunctival hemorrhage. This is a harmless collection of blood covering a portion of the white of the eye. This condition may be due to injury or to straining (lifting, sneezing, or coughing). Often, there is no known cause. Subconjunctival blood does not cause pain or vision problems. This condition needs no treatment. It will take 1 to 2 weeks for the blood to dissolve. If you  take aspirin or Coumadin on a daily basis or if you have high blood pressure, you should check with your doctor about the need for further treatment. Please call your doctor if you have problems with your vision, pain around the eye, or any other concerns about your condition. Document Released: 05/01/2004 Document Revised: 06/16/2011 Document Reviewed: 02/19/2009 [facility] Patient Information 2015 [place]. This information is not intended to replace advice given to you by your health care provider. Make sure you discuss any questions you have with your health care provider.  Apply the antibiotic given to your right eye every 4 hours until your pain symptoms are completely resolved.  Avoid rubbing your eye.  You may use motrin (ibuprofen if needed for pain relief).  Wear sun glasses to help minimize sun sensitivity.  Call Dr. Lita MainsHaines for a recheck if your pain symptoms are not significantly better (or gone) by Friday.  You may also return here if you have any worsening symptoms.

## 2014-08-07 NOTE — ED Notes (Signed)
PA Julie at bedside.  

## 2014-08-28 ENCOUNTER — Encounter (HOSPITAL_COMMUNITY): Payer: Self-pay | Admitting: *Deleted

## 2014-08-28 ENCOUNTER — Emergency Department (HOSPITAL_COMMUNITY)
Admission: EM | Admit: 2014-08-28 | Discharge: 2014-08-28 | Disposition: A | Discharge status: Home / Self Care | Payer: Self-pay | Attending: Emergency Medicine | Admitting: Emergency Medicine

## 2014-08-28 DIAGNOSIS — Y9289 Other specified places as the place of occurrence of the external cause: Secondary | ICD-10-CM | POA: Insufficient documentation

## 2014-08-28 DIAGNOSIS — X58XXXA Exposure to other specified factors, initial encounter: Secondary | ICD-10-CM | POA: Insufficient documentation

## 2014-08-28 DIAGNOSIS — S93502A Unspecified sprain of left great toe, initial encounter: Secondary | ICD-10-CM | POA: Insufficient documentation

## 2014-08-28 DIAGNOSIS — Y998 Other external cause status: Secondary | ICD-10-CM | POA: Insufficient documentation

## 2014-08-28 DIAGNOSIS — Y9389 Activity, other specified: Secondary | ICD-10-CM | POA: Insufficient documentation

## 2014-08-28 MED ORDER — NAPROXEN 500 MG PO TABS
500.0000 mg | ORAL_TABLET | Freq: Two times a day (BID) | ORAL | Status: DC
Start: 2014-08-28 — End: 2021-02-08

## 2014-08-28 NOTE — ED Provider Notes (Signed)
CSN: [id number]     Arrival date & time 08/28/14  2208 History   First MD Initiated Contact with Patient 08/28/14 2221     Chief Complaint  Patient presents with  . Foot Pain     (Consider location/radiation/quality/duration/timing/severity/associated sxs/prior Treatment) HPI   Miguel Ballard is a 24 y.o. male who presents to the Emergency Department complaining of left great toe pain for 3-4 days.  He reports an "aching pain" to the bottom of his toe only with weight bearing.  He states that he stands and walking all day at his job and thinks he may have injured his toe.  He denies trauma, redness, swelling, numbness or pain to any other part of his foot or ankle.  He has not tried any medications or therapies prior to ED arrival.     Past Medical History  Diagnosis Date  . Headache(784.0)    History reviewed. No pertinent past surgical history. History reviewed. No pertinent family history. History  Substance Use Topics  . Smoking status: Passive Smoke Exposure - Never Smoker  . Smokeless tobacco: Not on file  . Alcohol Use: Yes     Comment: occasional     Review of Systems  Constitutional: Negative for fever and chills.  Musculoskeletal: Positive for arthralgias (left great toe pain). Negative for joint swelling.  Skin: Negative for color change and wound.  Neurological: Negative for weakness and numbness.  All other systems reviewed and are negative.     Allergies  Review of patient's allergies indicates no known allergies.  Home Medications   Prior to Admission medications   Medication Sig Start Date End Date Taking? Authorizing Provider  naproxen (NAPROSYN) 500 MG tablet Take 1 tablet (500 mg total) by mouth 2 (two) times daily with a meal. 08/28/14   Nylee Barbuto, PA-C   BP 143/96 mmHg  Pulse 80  Temp(Src) 98.2 F (36.8 C)  Resp 20  Ht 5\' 7"  (1.702 m)  Wt 173 lb (78.472 kg)  BMI 27.09 kg/m2  SpO2 100% Physical Exam  Constitutional: He is oriented to  person, place, and time. He appears well-developed and well-nourished. No distress.  HENT:  Head: Normocephalic and atraumatic.  Cardiovascular: Normal rate, regular rhythm, normal heart sounds and intact distal pulses.   No murmur heard. Pulmonary/Chest: Effort normal and breath sounds normal. No respiratory distress.  Musculoskeletal: He exhibits no edema or tenderness.  Left great toe is non-tender on exam.  Pt has full ROM of the joint.  No edema, erythema or bony deformity.  Distal sensation intact.  DP pulse is strong and equal bilaterally  Neurological: He is alert and oriented to person, place, and time. He exhibits normal muscle tone. Coordination normal.  Skin: Skin is warm and dry.  Nursing note and vitals reviewed.   ED Course  Procedures (including critical care time) Labs Review Labs Reviewed - No data to display  Imaging Review No results found.   EKG Interpretation None      MDM   Final diagnoses:  Sprain of great toe of left foot, initial encounter    Pt ambulates with a steady gait.  Left great toe is nml appearing, no concerning sx's on exam.  Likely sprain. Imaging not indicated at this time.  Pt agrees to symptomatic tx and return if needed.      Pauline Ausammy Tiara Maultsby, PA-C 08/28/14 2301  Gilda Creasehristopher J Pollina, MD 08/28/14 [phone]

## 2014-08-28 NOTE — ED Notes (Signed)
Pt c/o pain to on the bottom of his foot just below the big toe x 3-4 days.

## 2014-08-28 NOTE — Discharge Instructions (Signed)
Sprain °A sprain happens when the bands of tissue that connect bones and hold joints together (ligaments) stretch too much or tear. °HOME CARE °· Raise (elevate) the injured area to lessen puffiness (swelling). °· Put ice on the injured area 2 times a day for 2-3 days. °¨ Put ice in a plastic bag. °¨ Place a towel between your skin and the bag. °¨ Leave the ice on for 15 minutes. °· Only take medicine as told by your doctor. °· Protect your injured area until your pain and stiffness go away. °· Do not get your cast or splint wet. Cover your cast or splint with a plastic bag when you shower or take a bath. Do not swim in a pool. °· Your doctor may suggest exercises during your recovery to keep from getting stiff. °GET HELP RIGHT AWAY IF:  °· Your cast or splint becomes damaged. °· Your pain gets worse. °MAKE SURE YOU:  °· Understand these instructions. °· Will watch this condition. °· Will get help right away if you are not doing well or get worse. °Document Released: 09/10/2007 Document Revised: 01/12/2013 Document Reviewed: 04/05/2011 °[employer]® Patient Information ©2015 [employer]. This information is not intended to replace advice given to you by your health care provider. Make sure you discuss any questions you have with your health care provider. ° °

## 2014-08-28 NOTE — ED Notes (Signed)
Pt alert & oriented x4, stable gait. Patient given discharge instructions, paperwork & prescription(s). Patient  instructed to stop at the registration desk to finish any additional paperwork. Patient verbalized understanding. Pt left department w/ no further questions. 

## 2017-12-05 ENCOUNTER — Emergency Department (HOSPITAL_COMMUNITY): Payer: Self-pay

## 2017-12-05 ENCOUNTER — Other Ambulatory Visit: Payer: Self-pay

## 2017-12-05 ENCOUNTER — Emergency Department (HOSPITAL_COMMUNITY)
Admission: EM | Admit: 2017-12-05 | Discharge: 2017-12-05 | Disposition: A | Discharge status: Home / Self Care | Payer: Self-pay | Attending: Emergency Medicine | Admitting: Emergency Medicine

## 2017-12-05 ENCOUNTER — Encounter (HOSPITAL_COMMUNITY): Payer: Self-pay | Admitting: Emergency Medicine

## 2017-12-05 DIAGNOSIS — R059 Cough, unspecified: Secondary | ICD-10-CM

## 2017-12-05 DIAGNOSIS — J029 Acute pharyngitis, unspecified: Secondary | ICD-10-CM | POA: Insufficient documentation

## 2017-12-05 DIAGNOSIS — Z79899 Other long term (current) drug therapy: Secondary | ICD-10-CM | POA: Insufficient documentation

## 2017-12-05 DIAGNOSIS — R0981 Nasal congestion: Secondary | ICD-10-CM | POA: Insufficient documentation

## 2017-12-05 DIAGNOSIS — R05 Cough: Secondary | ICD-10-CM | POA: Insufficient documentation

## 2017-12-05 DIAGNOSIS — F17220 Nicotine dependence, chewing tobacco, uncomplicated: Secondary | ICD-10-CM | POA: Insufficient documentation

## 2017-12-05 LAB — GROUP A STREP BY PCR: GROUP A STREP BY PCR: NOT DETECTED

## 2017-12-05 MED ORDER — LORATADINE-PSEUDOEPHEDRINE ER 5-120 MG PO TB12: 1.0000 | ORAL_TABLET | Freq: Two times a day (BID) | ORAL | 0 refills | Status: AC

## 2017-12-05 MED ORDER — BENZONATATE 200 MG PO CAPS: 200.0000 mg | ORAL_CAPSULE | Freq: Three times a day (TID) | ORAL | 0 refills | Status: DC | PRN

## 2017-12-05 NOTE — ED Triage Notes (Signed)
Pt c/o non-productive cough without fever x 1 month.

## 2017-12-05 NOTE — Discharge Instructions (Addendum)
Take the medications as directed.  You can contact the clinic listed to establish primary care if needed

## 2017-12-05 NOTE — ED Provider Notes (Signed)
[facility]ENCY [facility]PARTMENT Provider Note   CSN: [id number] Arrival date & time: 12/05/17  1600     History   Chief Complaint Chief Complaint  Patient presents with  . Cough    HPI Delfina Redwoodndrew C Lazarus is a 27 y.o. male.  HPI   Delfina Redwoodndrew C Orbach is a 27 y.o. male who presents to the Emergency Department complaining of persistent cough for nearly one month.  Describes the cough as non-productive and associated with a sore throat that began last evening.  No hx of asthma.  He denies nasal congestion, fever, chills, chest pain or tightness, and shortness of breath.  No recent exposures to fumes or chemicals.    Past Medical History:  Diagnosis Date  . Headache(784.0)     There are no active problems to display for this patient.   History reviewed. No pertinent surgical history.     Home Medications    Prior to Admission medications   Medication Sig Start Date End Date Taking? Authorizing Provider  naproxen (NAPROSYN) 500 MG tablet Take 1 tablet (500 mg total) by mouth 2 (two) times daily with a meal. 08/28/14   Meryem Haertel, PA-C    Family History No family history on file.  Social History Social History   Tobacco Use  . Smoking status: Passive Smoke Exposure - Never Smoker  . Smokeless tobacco: Current User    Types: Chew  Substance Use Topics  . Alcohol use: Yes    Comment: occasional   . Drug use: No     Allergies   Patient has no known allergies.   Review of Systems Review of Systems  Constitutional: Negative for appetite change, chills and fever.  HENT: Positive for congestion and sore throat. Negative for trouble swallowing.   Respiratory: Positive for cough. Negative for chest tightness, shortness of breath and wheezing.   Cardiovascular: Negative for chest pain.  Gastrointestinal: Negative for abdominal pain, nausea and vomiting.  Musculoskeletal: Negative for arthralgias.  Skin: Negative for rash.  Neurological: Negative for dizziness,  weakness, numbness and headaches.  Hematological: Negative for adenopathy.  All other systems reviewed and are negative.    Physical Exam Updated Vital Signs BP (!) 148/97 (BP Location: Right Arm)   Pulse 93   Temp 98.6 F (37 C) (Oral)   Resp 18   Ht 5\' 9"  (1.753 m)   Wt 81.6 kg   SpO2 98%   BMI 26.58 kg/m   Physical Exam  Constitutional: He appears well-developed and well-nourished. No distress.  HENT:  Head: Normocephalic and atraumatic.  Right Ear: Tympanic membrane and ear canal normal.  Left Ear: Tympanic membrane and ear canal normal.  Mouth/Throat: Uvula is midline and mucous membranes are normal. Posterior oropharyngeal erythema present. No oropharyngeal exudate or posterior oropharyngeal edema. No tonsillar exudate.  Eyes: Pupils are equal, round, and reactive to light. EOM are normal.  Neck: Normal range of motion, full passive range of motion without pain and phonation normal. Neck supple.  Cardiovascular: Normal rate, regular rhythm and intact distal pulses.  No murmur heard. Pulmonary/Chest: Effort normal. No respiratory distress. He has no wheezes. He has no rales. He exhibits no tenderness.  Musculoskeletal: He exhibits no edema.  Lymphadenopathy:    He has no cervical adenopathy.  Neurological: He is alert. No sensory deficit. Coordination normal.  Skin: Skin is warm and dry. Capillary refill takes less than 2 seconds.  Nursing note and vitals reviewed.    ED Treatments / Results  Labs (all  labs ordered are listed, but only abnormal results are displayed) Labs Reviewed  GROUP A STREP BY PCR    EKG None  Radiology Dg Chest 2 View  Result Date: 12/05/2017 CLINICAL DATA:  Cough, fever EXAM: CHEST - 2 VIEW COMPARISON:  04/21/2012 chest radiograph. FINDINGS: Stable cardiomediastinal silhouette with normal heart size. No pneumothorax. No pleural effusion. Lungs appear clear, with no acute consolidative airspace disease and no pulmonary edema.  IMPRESSION: No active cardiopulmonary disease. Electronically Signed   By: Delbert Phenix M.D.   On: 12/05/2017 17:16    Procedures Procedures (including critical care time)  Medications Ordered in ED Medications - No data to display   Initial Impression / Assessment and Plan / ED Course  I have reviewed the triage vital signs and the nursing notes.  Pertinent labs & imaging results that were available during my care of the patient were reviewed by me and considered in my medical decision making (see chart for details).    Pt well appearing,  Vitals reviewed.  X-ray and strep screen are negative.  Patient appears appropriate for discharge home agrees to treatment plan with Claritin-D and Tessalon Perles.  He is also given information for local clinic to establish primary care.   Final Clinical Impressions(s) / ED Diagnoses   Final diagnoses:  Cough    ED Discharge Orders    None       Pauline Aus, PA-C 12/05/17 1803    Loren Racer, MD 12/05/17 2229

## 2018-09-05 ENCOUNTER — Emergency Department (HOSPITAL_COMMUNITY)
Admission: EM | Admit: 2018-09-05 | Discharge: 2018-09-05 | Disposition: A | Discharge status: Home / Self Care | Payer: Self-pay | Attending: Emergency Medicine | Admitting: Emergency Medicine

## 2018-09-05 ENCOUNTER — Emergency Department (HOSPITAL_COMMUNITY): Payer: Self-pay

## 2018-09-05 ENCOUNTER — Other Ambulatory Visit: Payer: Self-pay

## 2018-09-05 ENCOUNTER — Encounter (HOSPITAL_COMMUNITY): Payer: Self-pay | Admitting: *Deleted

## 2018-09-05 DIAGNOSIS — Y999 Unspecified external cause status: Secondary | ICD-10-CM | POA: Insufficient documentation

## 2018-09-05 DIAGNOSIS — Y929 Unspecified place or not applicable: Secondary | ICD-10-CM | POA: Insufficient documentation

## 2018-09-05 DIAGNOSIS — S61212A Laceration without foreign body of right middle finger without damage to nail, initial encounter: Secondary | ICD-10-CM

## 2018-09-05 DIAGNOSIS — F17220 Nicotine dependence, chewing tobacco, uncomplicated: Secondary | ICD-10-CM | POA: Insufficient documentation

## 2018-09-05 DIAGNOSIS — W230XXA Caught, crushed, jammed, or pinched between moving objects, initial encounter: Secondary | ICD-10-CM | POA: Insufficient documentation

## 2018-09-05 DIAGNOSIS — Y9389 Activity, other specified: Secondary | ICD-10-CM | POA: Insufficient documentation

## 2018-09-05 DIAGNOSIS — Z23 Encounter for immunization: Secondary | ICD-10-CM | POA: Insufficient documentation

## 2018-09-05 DIAGNOSIS — S61312A Laceration without foreign body of right middle finger with damage to nail, initial encounter: Secondary | ICD-10-CM | POA: Insufficient documentation

## 2018-09-05 DIAGNOSIS — T1490XA Injury, unspecified, initial encounter: Secondary | ICD-10-CM

## 2018-09-05 DIAGNOSIS — Z79899 Other long term (current) drug therapy: Secondary | ICD-10-CM | POA: Insufficient documentation

## 2018-09-05 MED ORDER — TETANUS-DIPHTH-ACELL PERTUSSIS 5-2.5-18.5 LF-MCG/0.5 IM SUSP
0.5000 mL | Freq: Once | INTRAMUSCULAR | Status: AC
  Administered 2018-09-05: 0.5 mL via INTRAMUSCULAR
  Filled 2018-09-05: qty 0.5

## 2018-09-05 MED ORDER — LIDOCAINE HCL (PF) 1 % IJ SOLN
5.0000 mL | Freq: Once | INTRAMUSCULAR | Status: AC
  Administered 2018-09-05: 4 mL via INTRADERMAL
  Filled 2018-09-05: qty 6

## 2018-09-05 MED ORDER — CEPHALEXIN 500 MG PO CAPS
500.0000 mg | ORAL_CAPSULE | Freq: Once | ORAL | Status: AC
  Administered 2018-09-05: 500 mg via ORAL
  Filled 2018-09-05: qty 1

## 2018-09-05 MED ORDER — LIDOCAINE HCL (PF) 2 % IJ SOLN
INTRAMUSCULAR | Status: AC
  Filled 2018-09-05: qty 20

## 2018-09-05 MED ORDER — CEPHALEXIN 500 MG PO CAPS: 500.0000 mg | ORAL_CAPSULE | Freq: Four times a day (QID) | ORAL | 0 refills | Status: DC

## 2018-09-05 MED ORDER — BACITRACIN-NEOMYCIN-POLYMYXIN 400-5-5000 EX OINT
TOPICAL_OINTMENT | Freq: Once | CUTANEOUS | Status: AC
  Administered 2018-09-05: 22:00:00 via TOPICAL
  Filled 2018-09-05: qty 2

## 2018-09-05 MED ORDER — BACITRACIN ZINC 500 UNIT/GM EX OINT
TOPICAL_OINTMENT | CUTANEOUS | Status: AC
  Administered 2018-09-05: 1
  Filled 2018-09-05: qty 1.8

## 2018-09-05 NOTE — ED Notes (Signed)
From Rad 

## 2018-09-05 NOTE — ED Notes (Signed)
Pt on phone on speaker   Awaiting rad

## 2018-09-05 NOTE — ED Provider Notes (Signed)
[facility] Provider Note   CSN: [id number] Arrival date & time: 09/05/18  2007    History   Chief Complaint Chief Complaint  Patient presents with  . Finger Injury    HPI Miguel Ballard is a 28 y.o. male.     Patient is a 28 year old gentleman with no past medical history presenting to the emergency department for injury to the right distal middle finger.  Patient reports that yesterday around 8 AM when he was working on a vehicle there was a fast spinning bolts which came in contact with the nail of his right middle finger causing a laceration.  Reports that he has been keeping it clean since yesterday but that friends and family told him he should come to the emergency department because it looks bad.  Denies any ongoing bleeding.  Unknown last tetanus shot     Past Medical History:  Diagnosis Date  . Headache(784.0)     There are no active problems to display for this patient.   History reviewed. No pertinent surgical history.      Home Medications    Prior to Admission medications   Medication Sig Start Date End Date Taking? Authorizing Provider  benzonatate (TESSALON) 200 MG capsule Take 1 capsule (200 mg total) by mouth 3 (three) times daily as needed for cough. Swallow whole, do not chew 12/05/17   Triplett, Tammy, PA-C  cephALEXin (KEFLEX) 500 MG capsule Take 1 capsule (500 mg total) by mouth 4 (four) times daily. 09/05/18   Arlyn DunningMcLean, Myrta Mercer A, PA-C  loratadine-pseudoephedrine (CLARITIN-D 12 HOUR) 5-120 MG tablet Take 1 tablet by mouth 2 (two) times daily. 12/05/17   Triplett, Tammy, PA-C  naproxen (NAPROSYN) 500 MG tablet Take 1 tablet (500 mg total) by mouth 2 (two) times daily with a meal. 08/28/14   Triplett, Tammy, PA-C    Family History History reviewed. No pertinent family history.  Social History Social History   Tobacco Use  . Smoking status: Passive Smoke Exposure - Never Smoker  . Smokeless tobacco: Current User    Types: Chew   Substance Use Topics  . Alcohol use: Yes    Comment: occasional   . Drug use: No     Allergies   Patient has no known allergies.   Review of Systems Review of Systems  Constitutional: Negative for fever.  Musculoskeletal: Negative for arthralgias and joint swelling.  Skin: Positive for wound.  Allergic/Immunologic: Negative for immunocompromised state.  Hematological: Does not bruise/bleed easily.     Physical Exam Updated Vital Signs BP (!) 124/95 (BP Location: Left Arm)   Pulse 90   Temp 98.2 F (36.8 C) (Oral)   Resp 16   SpO2 99%   Physical Exam Vitals signs and nursing note reviewed.  Constitutional:      Appearance: Normal appearance.  HENT:     Head: Normocephalic.  Eyes:     Conjunctiva/sclera: Conjunctivae normal.  Pulmonary:     Effort: Pulmonary effort is normal.  Musculoskeletal:     Right wrist: Normal.     Right hand: He exhibits tenderness and bony tenderness. He exhibits normal range of motion, normal two-point discrimination and normal capillary refill. Decreased sensation is not present in the ulnar distribution and is not present in the radial distribution.     Comments: Distal right finger with swelling and laceration to the proximal nail.  Skin:    General: Skin is dry.  Neurological:     Mental Status: He is alert.  Psychiatric:        Mood and Affect: Mood normal.          ED Treatments / Results  Labs (all labs ordered are listed, but only abnormal results are displayed) Labs Reviewed - No data to display  EKG None  Radiology Dg Hand Complete Right  Result Date: 09/05/2018 CLINICAL DATA:  Right middle finger laceration. EXAM: RIGHT HAND - COMPLETE 3+ VIEW COMPARISON:  None. FINDINGS: Soft tissue laceration at the tip of the right middle finger. Small radiopaque densities within the soft tissues or along the skin surface. No fracture, subluxation or dislocation. IMPRESSION: No acute bony abnormality. Small radiopaque  densities could be within the soft tissues or along the skin surface. Electronically Signed   By: Charlett Nose M.D.   On: 09/05/2018 21:01    Procedures Debridement Date/Time: 09/05/2018 10:07 PM Performed by: Arlyn Dunning, PA-C Authorized by: Arlyn Dunning, PA-C  Patient consent: the patient's understanding of the procedure matches consent given Procedure consent: procedure consent matches procedure scheduled Relevant documents: relevant documents present and verified Test results: test results available and properly labeled Site marked: the operative site was marked Imaging studies: imaging studies available Patient identity confirmed: verbally with patient Preparation: Patient was prepped and draped in the usual sterile fashion. Local anesthesia used: yes Anesthesia method: transthecal digital block.  Anesthesia: Local anesthesia used: yes Anesthetic total: 10 mL  Sedation: Patient sedated: no  Patient tolerance: Patient tolerated the procedure well with no immediate complications Comments: Patient was cleaned and prepped in sterile fashion, intrathecal nerve block was applied with good affect. Wound was scrubbed with betadine and irrigated with sterile saline. Wound explored in full range. There were lacerations to nail fold (proximal) But tight swelling of the skin prevented proper repair with suture. Laceration >24 hours old as well and it was thought best by myself and Dr. Denton Lank to leave wound open. Patient tolerated well. Wound was dressed with bacitracin and guaze and he was splinted with foam finger splint.     (including critical care time)  Medications Ordered in ED Medications  lidocaine (XYLOCAINE) 2 % injection (10 mLs  Handoff 09/05/18 2124)  neomycin-bacitracin-polymyxin (NEOSPORIN) ointment packet (has no administration in time range)  cephALEXin (KEFLEX) capsule 500 mg (has no administration in time range)  bacitracin 500 UNIT/GM ointment (has no  administration in time range)  lidocaine (PF) (XYLOCAINE) 1 % injection 5 mL (4 mLs Intradermal Given 09/05/18 2023)  Tdap (BOOSTRIX) injection 0.5 mL (0.5 mLs Intramuscular Given 09/05/18 2132)     Initial Impression / Assessment and Plan / ED Course  I have reviewed the triage vital signs and the nursing notes.  Pertinent labs & imaging results that were available during my care of the patient were reviewed by me and considered in my medical decision making (see chart for details).  Clinical Course as of Sep 05 2206  Wynelle Link Sep 05, 2018  2035 Patient with finger laceration >24 hours. No known tetanus. Will update tetanus, get xray, perform nerve block and wash out the finger.    [KM]  2134 I washed out patients finger thoroughly. Patient also examined by Dr. Denton Lank. We agree there are no areas amendable to suturing, especially since wound is >24 hours. No underlying fracture. Will start keflex as the wound itself was dirty. F/u with Dr. Amanda Pea   [KM]    Clinical Course User Index [KM] Arlyn Dunning, PA-C       Based on review  of vitals, medical screening exam, lab work and/or imaging, there does not appear to be an acute, emergent etiology for the patient's symptoms. Counseled pt on good return precautions and encouraged both PCP and ED follow-up as needed.  Prior to discharge, I also discussed incidental imaging findings with patient in detail and advised appropriate, recommended follow-up in detail.  Clinical Impression: 1. Laceration of right middle finger, foreign body presence unspecified, nail damage status unspecified, initial encounter   2. Injury     Disposition: Discharge  Prior to providing a prescription for a controlled substance, I independently reviewed the patient's recent prescription history on the [place] Controlled Substance Reporting System. The patient had no recent or regular prescriptions and was deemed appropriate for a brief, less than 3 day  prescription of narcotic for acute analgesia.  This note was prepared with assistance of Conservation officer, historic buildings. Occasional wrong-word or sound-a-like substitutions may have occurred due to the inherent limitations of voice recognition software.   Final Clinical Impressions(s) / ED Diagnoses   Final diagnoses:  Laceration of right middle finger, foreign body presence unspecified, nail damage status unspecified, initial encounter    ED Discharge Orders         Ordered    cephALEXin (KEFLEX) 500 MG capsule  4 times daily     09/05/18 2158           Jeral Pinch 09/05/18 2208    Cathren Laine, MD 09/06/18 [phone]

## 2018-09-05 NOTE — ED Notes (Signed)
Pt offered pos

## 2018-09-05 NOTE — Discharge Instructions (Signed)
Keep wound clean and dry and take antibiotics as prescribe. Return if you have any signs of infection including pus from the wound, increased redness, fever, or increased pain. Thank you for allowing me to care for you today. Please return to the emergency department if you have new or worsening symptoms. Take your medications as instructed.

## 2018-09-05 NOTE — ED Triage Notes (Signed)
Pt injured tip of right middle finger, cut from a bolt.

## 2018-09-06 ENCOUNTER — Telehealth (HOSPITAL_COMMUNITY): Payer: Self-pay | Admitting: Emergency Medicine

## 2018-09-06 MED ORDER — CEPHALEXIN 500 MG PO CAPS: 500.0000 mg | ORAL_CAPSULE | Freq: Three times a day (TID) | ORAL | 0 refills | Status: AC

## 2018-09-06 NOTE — Telephone Encounter (Signed)
Issue obtaining prescription, here requesting printed prescription.

## 2020-03-05 ENCOUNTER — Emergency Department (HOSPITAL_COMMUNITY): Payer: Self-pay

## 2020-03-05 ENCOUNTER — Emergency Department (HOSPITAL_COMMUNITY)
Admission: EM | Admit: 2020-03-05 | Discharge: 2020-03-06 | Disposition: A | Discharge status: Home / Self Care | Payer: Self-pay | Attending: Emergency Medicine | Admitting: Emergency Medicine

## 2020-03-05 ENCOUNTER — Other Ambulatory Visit: Payer: Self-pay

## 2020-03-05 ENCOUNTER — Encounter (HOSPITAL_COMMUNITY): Payer: Self-pay

## 2020-03-05 DIAGNOSIS — T1490XA Injury, unspecified, initial encounter: Secondary | ICD-10-CM

## 2020-03-05 DIAGNOSIS — S60417A Abrasion of left little finger, initial encounter: Secondary | ICD-10-CM | POA: Insufficient documentation

## 2020-03-05 DIAGNOSIS — S62655A Nondisplaced fracture of medial phalanx of left ring finger, initial encounter for closed fracture: Secondary | ICD-10-CM | POA: Insufficient documentation

## 2020-03-05 DIAGNOSIS — S60413A Abrasion of left middle finger, initial encounter: Secondary | ICD-10-CM | POA: Diagnosis not present

## 2020-03-05 DIAGNOSIS — S8252XA Displaced fracture of medial malleolus of left tibia, initial encounter for closed fracture: Secondary | ICD-10-CM | POA: Insufficient documentation

## 2020-03-05 DIAGNOSIS — S99912A Unspecified injury of left ankle, initial encounter: Secondary | ICD-10-CM | POA: Diagnosis present

## 2020-03-05 DIAGNOSIS — S60415A Abrasion of left ring finger, initial encounter: Secondary | ICD-10-CM | POA: Insufficient documentation

## 2020-03-05 DIAGNOSIS — S82892A Other fracture of left lower leg, initial encounter for closed fracture: Secondary | ICD-10-CM

## 2020-03-05 DIAGNOSIS — S60512A Abrasion of left hand, initial encounter: Secondary | ICD-10-CM
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
## 2020-03-06 ENCOUNTER — Encounter: Payer: Self-pay | Admitting: Orthopedic Surgery

## 2020-03-06 ENCOUNTER — Ambulatory Visit (INDEPENDENT_AMBULATORY_CARE_PROVIDER_SITE_OTHER): Payer: Self-pay

## 2020-03-06 ENCOUNTER — Telehealth: Payer: Self-pay | Admitting: Radiology

## 2020-03-06 ENCOUNTER — Ambulatory Visit: Payer: Self-pay | Admitting: Orthopedic Surgery

## 2020-03-06 ENCOUNTER — Other Ambulatory Visit: Payer: Self-pay | Admitting: Orthopedic Surgery

## 2020-03-06 VITALS — BP 160/99 | HR 104 | Ht 67.0 in | Wt 200.0 lb

## 2020-03-06 DIAGNOSIS — S62625A Displaced fracture of medial phalanx of left ring finger, initial encounter for closed fracture: Secondary | ICD-10-CM

## 2020-03-06 DIAGNOSIS — S82892A Other fracture of left lower leg, initial encounter for closed fracture: Secondary | ICD-10-CM

## 2020-03-06 MED ORDER — CEPHALEXIN 500 MG PO CAPS
500.0000 mg | ORAL_CAPSULE | Freq: Once | ORAL | Status: AC
  Administered 2020-03-06: 500 mg via ORAL
  Filled 2020-03-06: qty 1

## 2020-03-06 MED ORDER — HYDROCODONE-ACETAMINOPHEN 5-325 MG PO TABS
1.0000 | ORAL_TABLET | Freq: Once | ORAL | Status: AC
  Administered 2020-03-06: 1 via ORAL
  Filled 2020-03-06: qty 1

## 2020-03-06 MED ORDER — CEPHALEXIN 500 MG PO CAPS: 500.0000 mg | ORAL_CAPSULE | Freq: Three times a day (TID) | ORAL | 0 refills | Status: DC

## 2020-03-06 MED ORDER — IBUPROFEN 600 MG PO TABS: 600.0000 mg | ORAL_TABLET | Freq: Four times a day (QID) | ORAL | 0 refills | Status: DC | PRN

## 2020-03-06 MED ORDER — IBUPROFEN 400 MG PO TABS
600.0000 mg | ORAL_TABLET | Freq: Once | ORAL | Status: AC
  Administered 2020-03-06: 600 mg via ORAL
  Filled 2020-03-06: qty 2

## 2020-03-06 MED ORDER — HYDROCODONE-ACETAMINOPHEN 5-325 MG PO TABS: 1.0000 | ORAL_TABLET | Freq: Four times a day (QID) | ORAL | 0 refills | Status: DC | PRN

## 2020-03-06 NOTE — Telephone Encounter (Signed)
Phone call from [facility] Patient needs to go to Dr Melvyn Novas he was on call last night I have sent information urgent to his office  He was on call, the ER lists Dr Janee Morn, but they switched and Dr Melvyn Novas was on call  I put the referral in [employer] and called over to [facility] and am working on this still  To you [employer]

## 2020-03-06 NOTE — Discharge Instructions (Addendum)
Keep your wounds clean and dry.  Change the dressings on your fingers daily and use antibiotic ointment over the abrasions.  Keep the ring finger splinted because you do have a fracture.  Keep your splint clean and dry.  Take the medication as prescribed for pain and the antibiotic to help prevent infection.  Use the crutches and do not walk or bear weight on your left ankle until you are evaluated by the orthopedist on-call, Dr. Vangie Bicker.  Please call his office to get an appointment.
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
## 2020-03-07 NOTE — Telephone Encounter (Signed)
Thank You Miguel Ballard  As long as they have reviewed his injury, and the surgeon is aware of the severity, I think that is reasonable.  Thank you for following up for the patient.  [name]

## 2020-03-07 NOTE — Telephone Encounter (Signed)
03/15/20,  Miguel Ballard and team reviewed and the appointment is scheduled, but not until Next week on Thursday  Is that okay? Or do you want me to call over there and ask for sooner?

## 2020-03-20 ENCOUNTER — Ambulatory Visit: Payer: Self-pay | Admitting: Orthopedic Surgery

## 2020-03-21 ENCOUNTER — Ambulatory Visit: Payer: Self-pay | Admitting: Orthopedic Surgery

## 2020-03-27 ENCOUNTER — Encounter: Payer: Self-pay | Admitting: Orthopedic Surgery

## 2020-03-27 ENCOUNTER — Ambulatory Visit: Payer: Self-pay | Admitting: Orthopedic Surgery

## 2021-02-07 ENCOUNTER — Encounter (HOSPITAL_COMMUNITY): Payer: Self-pay

## 2021-02-07 ENCOUNTER — Emergency Department (HOSPITAL_COMMUNITY)
Admission: EM | Admit: 2021-02-07 | Discharge: 2021-02-08 | Disposition: A | Discharge status: Home / Self Care | Payer: Self-pay | Attending: Emergency Medicine | Admitting: Emergency Medicine

## 2021-02-07 ENCOUNTER — Other Ambulatory Visit: Payer: Self-pay

## 2021-02-07 DIAGNOSIS — F10120 Alcohol abuse with intoxication, uncomplicated: Secondary | ICD-10-CM | POA: Insufficient documentation

## 2021-02-07 DIAGNOSIS — F17220 Nicotine dependence, chewing tobacco, uncomplicated: Secondary | ICD-10-CM | POA: Insufficient documentation

## 2021-02-07 DIAGNOSIS — F10920 Alcohol use, unspecified with intoxication, uncomplicated: Secondary | ICD-10-CM

## 2021-02-07 DIAGNOSIS — S80211A Abrasion, right knee, initial encounter: Secondary | ICD-10-CM | POA: Insufficient documentation

## 2021-02-07 DIAGNOSIS — T1490XA Injury, unspecified, initial encounter: Secondary | ICD-10-CM

## 2021-02-07 DIAGNOSIS — S0181XA Laceration without foreign body of other part of head, initial encounter: Secondary | ICD-10-CM | POA: Insufficient documentation

## 2021-02-07 DIAGNOSIS — E876 Hypokalemia: Secondary | ICD-10-CM | POA: Insufficient documentation

## 2021-02-07 DIAGNOSIS — Y92410 Unspecified street and highway as the place of occurrence of the external cause: Secondary | ICD-10-CM | POA: Insufficient documentation

## 2021-02-07 DIAGNOSIS — S01112A Laceration without foreign body of left eyelid and periocular area, initial encounter: Secondary | ICD-10-CM | POA: Insufficient documentation

## 2021-02-07 DIAGNOSIS — S3991XA Unspecified injury of abdomen, initial encounter: Secondary | ICD-10-CM | POA: Insufficient documentation

## 2021-02-07 DIAGNOSIS — Y908 Blood alcohol level of 240 mg/100 ml or more: Secondary | ICD-10-CM | POA: Insufficient documentation

## 2021-02-07 DIAGNOSIS — S70211A Abrasion, right hip, initial encounter: Secondary | ICD-10-CM | POA: Insufficient documentation

## 2021-02-07 DIAGNOSIS — S80212A Abrasion, left knee, initial encounter: Secondary | ICD-10-CM | POA: Insufficient documentation

## 2021-02-07 DIAGNOSIS — S2241XA Multiple fractures of ribs, right side, initial encounter for closed fracture: Secondary | ICD-10-CM | POA: Insufficient documentation

## 2021-02-07 MED ORDER — LACTATED RINGERS IV BOLUS
1000.0000 mL | Freq: Once | INTRAVENOUS | Status: AC
  Administered 2021-02-08: 1000 mL via INTRAVENOUS

## 2021-02-07 MED ORDER — TETANUS-DIPHTH-ACELL PERTUSSIS 5-2.5-18.5 LF-MCG/0.5 IM SUSY
0.5000 mL | PREFILLED_SYRINGE | Freq: Once | INTRAMUSCULAR | Status: DC
  Filled 2021-02-07: qty 0.5
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
## 2021-02-08 ENCOUNTER — Emergency Department (HOSPITAL_COMMUNITY): Payer: Self-pay

## 2021-02-08 LAB — SAMPLE TO BLOOD BANK

## 2021-02-08 LAB — COMPREHENSIVE METABOLIC PANEL
ALT: 40 U/L (ref 0–44)
AST: 35 U/L (ref 15–41)
Albumin: 4.2 g/dL (ref 3.5–5.0)
Alkaline Phosphatase: 77 U/L (ref 38–126)
Anion gap: 11 (ref 5–15)
BUN: 11 mg/dL (ref 6–20)
CO2: 24 mmol/L (ref 22–32)
Calcium: 8.4 mg/dL — ABNORMAL LOW (ref 8.9–10.3)
Chloride: 102 mmol/L (ref 98–111)
Creatinine, Ser: 1.11 mg/dL (ref 0.61–1.24)
GFR, Estimated: >60 mL/min (ref >60)
Glucose, Bld: 107 mg/dL — ABNORMAL HIGH (ref 70–99)
Potassium: 3.2 mmol/L — ABNORMAL LOW (ref 3.5–5.1)
Sodium: 137 mmol/L (ref 135–145)
Total Bilirubin: 0.7 mg/dL (ref 0.3–1.2)
Total Protein: 7.6 g/dL (ref 6.5–8.1)

## 2021-02-08 LAB — CBC
HCT: 46.2 % (ref 39.0–52.0)
Hemoglobin: 15.4 g/dL (ref 13.0–17.0)
MCH: 30.2 pg (ref 26.0–34.0)
MCHC: 33.3 g/dL (ref 30.0–36.0)
MCV: 90.6 fL (ref 80.0–100.0)
Platelets: 272 10*3/uL (ref 150–400)
RBC: 5.1 MIL/uL (ref 4.22–5.81)
RDW: 12.6 % (ref 11.5–15.5)
WBC: 6.1 10*3/uL (ref 4.0–10.5)
nRBC: 0 % (ref 0.0–0.2)

## 2021-02-08 LAB — ETHANOL: Alcohol, Ethyl (B): 273 mg/dL — ABNORMAL HIGH (ref <10)

## 2021-02-08 LAB — LACTIC ACID, PLASMA: Lactic Acid, Venous: 1.8 mmol/L (ref 0.5–1.9)

## 2021-02-08 LAB — PROTIME-INR
INR: 0.9 (ref 0.8–1.2)
Prothrombin Time: 12.1 seconds (ref 11.4–15.2)

## 2021-02-08 MED ORDER — STERILE WATER FOR INJECTION IJ SOLN
INTRAMUSCULAR | Status: AC
  Administered 2021-02-08: 10 mL
  Filled 2021-02-08: qty 10

## 2021-02-08 MED ORDER — LIDOCAINE-EPINEPHRINE (PF) 1 %-1:200000 IJ SOLN
10.0000 mL | Freq: Once | INTRAMUSCULAR | Status: AC
  Administered 2021-02-08: 10 mL
  Filled 2021-02-08: qty 30

## 2021-02-08 MED ORDER — IOHEXOL 300 MG/ML  SOLN
100.0000 mL | Freq: Once | INTRAMUSCULAR | Status: AC | PRN
  Administered 2021-02-08: 100 mL via INTRAVENOUS

## 2021-02-08 MED ORDER — NAPROXEN 500 MG PO TABS: 500.0000 mg | ORAL_TABLET | Freq: Two times a day (BID) | ORAL | 0 refills | Status: AC

## 2021-02-08 MED ORDER — ZIPRASIDONE MESYLATE 20 MG IM SOLR
20.0000 mg | Freq: Once | INTRAMUSCULAR | Status: AC
  Administered 2021-02-08: 20 mg via INTRAMUSCULAR
  Filled 2021-02-08: qty 20

## 2021-02-08 MED ORDER — POTASSIUM CHLORIDE CRYS ER 20 MEQ PO TBCR: 20.0000 meq | EXTENDED_RELEASE_TABLET | Freq: Two times a day (BID) | ORAL | 0 refills | Status: AC

## 2021-02-08 MED ORDER — POTASSIUM CHLORIDE CRYS ER 20 MEQ PO TBCR
40.0000 meq | EXTENDED_RELEASE_TABLET | Freq: Once | ORAL | Status: AC
  Administered 2021-02-08: 40 meq via ORAL
  Filled 2021-02-08: qty 2
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
## 2021-02-08 NOTE — ED Notes (Signed)
Dr. Preston Fleeting at bedside for laceration repair.
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
## 2022-06-05 ENCOUNTER — Encounter: Payer: Self-pay | Admitting: Radiology
